# Patient Record
Sex: Male | Born: 1990
Health system: Southern US, Community
[De-identification: ages and names within clinical notes are randomized; demographics above are authoritative.]

---

## 2015-10-31 DIAGNOSIS — I10 Essential (primary) hypertension: Secondary | ICD-10-CM

## 2015-10-31 HISTORY — DX: Essential (primary) hypertension: I10

## 2016-04-01 HISTORY — PX: MULTIPLE TOOTH EXTRACTIONS: SHX2053

## 2016-05-27 DIAGNOSIS — E782 Mixed hyperlipidemia: Secondary | ICD-10-CM | POA: Insufficient documentation

## 2016-05-27 HISTORY — DX: Mixed hyperlipidemia: E78.2

## 2017-02-05 DIAGNOSIS — F4310 Post-traumatic stress disorder, unspecified: Secondary | ICD-10-CM | POA: Insufficient documentation

## 2017-02-05 HISTORY — DX: Post-traumatic stress disorder, unspecified: F43.10

## 2020-09-25 ENCOUNTER — Emergency Department (HOSPITAL_COMMUNITY)
Admission: EM | Admit: 2020-09-25 | Discharge: 2020-09-25 | Disposition: A | Discharge status: Home / Self Care | Payer: BC Managed Care – PPO | Attending: Emergency Medicine | Admitting: Emergency Medicine

## 2020-09-25 ENCOUNTER — Encounter (HOSPITAL_COMMUNITY): Payer: Self-pay | Admitting: Emergency Medicine

## 2020-09-25 ENCOUNTER — Emergency Department (HOSPITAL_COMMUNITY): Payer: BC Managed Care – PPO

## 2020-09-25 ENCOUNTER — Other Ambulatory Visit: Payer: Self-pay

## 2020-09-25 DIAGNOSIS — I10 Essential (primary) hypertension: Secondary | ICD-10-CM

## 2020-09-25 DIAGNOSIS — R519 Headache, unspecified: Secondary | ICD-10-CM | POA: Diagnosis not present

## 2020-09-25 LAB — TROPONIN I (HIGH SENSITIVITY)
Troponin I (High Sensitivity): 3 ng/L (ref <18)
Troponin I (High Sensitivity): 3 ng/L (ref <18)

## 2020-09-25 LAB — CBC WITH DIFFERENTIAL/PLATELET
Abs Immature Granulocytes: 0.02 10*3/uL (ref 0.00–0.07)
Basophils Absolute: 0 10*3/uL (ref 0.0–0.1)
Basophils Relative: 0 %
Eosinophils Absolute: 0.2 10*3/uL (ref 0.0–0.5)
Eosinophils Relative: 4 %
HCT: 47.1 % (ref 39.0–52.0)
Hemoglobin: 16.4 g/dL (ref 13.0–17.0)
Immature Granulocytes: 0 %
Lymphocytes Relative: 31 %
Lymphs Abs: 1.9 10*3/uL (ref 0.7–4.0)
MCH: 31.3 pg (ref 26.0–34.0)
MCHC: 34.8 g/dL (ref 30.0–36.0)
MCV: 89.9 fL (ref 80.0–100.0)
Monocytes Absolute: 0.5 10*3/uL (ref 0.1–1.0)
Monocytes Relative: 8 %
Neutro Abs: 3.5 10*3/uL (ref 1.7–7.7)
Neutrophils Relative %: 57 %
Platelets: 266 10*3/uL (ref 150–400)
RBC: 5.24 MIL/uL (ref 4.22–5.81)
RDW: 11.4 % — ABNORMAL LOW (ref 11.5–15.5)
WBC: 6.2 10*3/uL (ref 4.0–10.5)
nRBC: 0 % (ref 0.0–0.2)

## 2020-09-25 LAB — BASIC METABOLIC PANEL
Anion gap: 9 (ref 5–15)
BUN: 10 mg/dL (ref 6–20)
CO2: 25 mmol/L (ref 22–32)
Calcium: 9.1 mg/dL (ref 8.9–10.3)
Chloride: 104 mmol/L (ref 98–111)
Creatinine, Ser: 0.9 mg/dL (ref 0.61–1.24)
GFR, Estimated: >60 mL/min (ref >60)
Glucose, Bld: 125 mg/dL — ABNORMAL HIGH (ref 70–99)
Potassium: 4 mmol/L (ref 3.5–5.1)
Sodium: 138 mmol/L (ref 135–145)

## 2020-09-25 MED ORDER — HYDROCHLOROTHIAZIDE 12.5 MG PO TABS: 12.5000 mg | ORAL_TABLET | Freq: Every day | ORAL | 0 refills | Status: DC

## 2020-09-25 MED ORDER — CLONIDINE HCL 0.1 MG PO TABS
0.1000 mg | ORAL_TABLET | Freq: Once | ORAL | Status: AC
  Administered 2020-09-25: 0.1 mg via ORAL
  Filled 2020-09-25: qty 1

## 2020-09-25 NOTE — ED Provider Notes (Signed)
[facility] EMERGENCY DEPARTMENT Provider Note   CSN: [id number] Arrival date & time: 09/25/20  0831    History Hypertension  Reginald Kaiser is a 30 y.o. male past medical history significant for hypertension who presents for evaluation of elevated blood pressure.  Was previously on 4 blood pressure medications up until 2 years ago.  Patient states he had a work-up and they could not find a cause for this.  His blood pressures ended up improving and he was subsequently stopped.  Patient had a headache over the weekend, felt lightheaded and dizzy.  States he took his blood pressure which was elevated.  He took a few of his daughters clonidine tablets which resolved his headache.  Has had persistently elevated blood pressures.  Came here for evaluation.  Stated earlier today he felt "tingly" all over.  He had no associated weakness, facial droop, difficulty with word finding, headache.  No neck pain or neck stiffness.  Took blood pressure at work sent here for evaluation.  He currently has no symptoms.  Denies additional rating or alleviating factors.  No emesis, chest pain, shortness breath, abdominal pain, weakness, vision changes  History obtained from patient and past medical records.  No interpreter used    HPI     Past Medical History:  Diagnosis Date   Hypertension     There are no problems to display for this patient.   History reviewed. No pertinent surgical history.     No family history on file.     Home Medications Prior to Admission medications   Medication Sig Start Date End Date Taking? Authorizing Provider  hydrochlorothiazide (HYDRODIURIL) 12.5 MG tablet Take 1 tablet (12.5 mg total) by mouth daily. 09/25/20 10/25/20 Yes Cephus Tupy A, PA-C    Allergies    Patient has no allergy information on record.  Review of Systems   Review of Systems  Constitutional: Negative.   HENT: Negative.    Respiratory: Negative.     Cardiovascular: Negative.   Genitourinary: Negative.   Musculoskeletal: Negative.   Neurological:  Positive for dizziness, light-headedness and headaches. Negative for tremors, seizures, syncope, facial asymmetry, speech difficulty and weakness. Numbness: Tingling. All other systems reviewed and are negative.  Physical Exam Updated Vital Signs BP (!) 140/99   Pulse 66   Temp 98.3 F (36.8 C) (Oral)   Resp 16   Ht 5\' 8"  (1.727 m)   Wt 81.6 kg   SpO2 98%   BMI 27.37 kg/m   Physical Exam Physical Exam  Constitutional: Pt is oriented to person, place, and time. Pt appears well-developed and well-nourished. No distress.  HENT:  Head: Normocephalic and atraumatic.  Mouth/Throat: Oropharynx is clear and moist.  Eyes: Conjunctivae and EOM are normal. Pupils are equal, round, and reactive to light. No scleral icterus.  No horizontal, vertical or rotational nystagmus  Neck: Normal range of motion. Neck supple.  Full active and passive ROM without pain No midline or paraspinal tenderness No nuchal rigidity or meningeal signs  Cardiovascular: Normal rate, regular rhythm and intact distal pulses.   Pulmonary/Chest: Effort normal and breath sounds normal. No respiratory distress. Pt has no wheezes. No rales.  Abdominal: Soft. Bowel sounds are normal. There is no tenderness. There is no rebound and no guarding.  Musculoskeletal: Normal range of motion.  Lymphadenopathy:    No cervical adenopathy.  Neurological: Pt. is alert and oriented to person, place, and time. He has normal reflexes. No cranial nerve deficit.  Exhibits normal muscle  tone. Coordination normal.  Mental Status:  Alert, oriented, thought content appropriate. Speech fluent without evidence of aphasia. Able to follow 2 step commands without difficulty.  Cranial Nerves:  II:  Peripheral visual fields grossly normal, pupils equal, round, reactive to light III,IV, VI: ptosis not present, extra-ocular motions intact  bilaterally  V,VII: smile symmetric, facial light touch sensation equal VIII: hearing grossly normal bilaterally  IX,X: midline uvula rise  XI: bilateral shoulder shrug equal and strong XII: midline tongue extension  Motor:  5/5 in upper and lower extremities bilaterally including strong and equal grip strength and dorsiflexion/plantar flexion Sensory: Pinprick and light touch normal in all extremities.  Deep Tendon Reflexes: 2+ and symmetric  Cerebellar: normal finger-to-nose with bilateral upper extremities Gait: normal gait and balance CV: distal pulses palpable throughout   Skin: Skin is warm and dry. No rash noted. Pt is not diaphoretic.  Psychiatric: Pt has a normal mood and affect. Behavior is normal. Judgment and thought content normal.  Nursing note and vitals reviewed.  ED Results / Procedures / Treatments   Labs (all labs ordered are listed, but only abnormal results are displayed) Labs Reviewed  CBC WITH DIFFERENTIAL/PLATELET - Abnormal; Notable for the following components:      Result Value   RDW 11.4 (*)    All other components within normal limits  BASIC METABOLIC PANEL - Abnormal; Notable for the following components:   Glucose, Bld 125 (*)    All other components within normal limits  TROPONIN I (HIGH SENSITIVITY)  TROPONIN I (HIGH SENSITIVITY)    EKG EKG Interpretation  Date/Time:  Monday September 25 2020 09:33:20 EDT Ventricular Rate:  76 PR Interval:  143 QRS Duration: 84 QT Interval:  368 QTC Calculation: 414 R Axis:   44 Text Interpretation: Sinus arrhythmia RSR' in V1 or V2, probably normal variant No previous tracing Confirmed by Gwyneth Sprout ([id number]) on 09/25/2020 10:42:24 AM  Radiology CT Head Wo Contrast  Result Date: 09/25/2020 CLINICAL DATA:  Chronic headaches EXAM: CT HEAD WITHOUT CONTRAST TECHNIQUE: Contiguous axial images were obtained from the base of the skull through the vertex without intravenous contrast. COMPARISON:  None. FINDINGS:  Brain: No evidence of acute infarction, hemorrhage, hydrocephalus, extra-axial collection or mass lesion/mass effect. Vascular: No hyperdense vessel or unexpected calcification. Skull: Normal. Negative for fracture or focal lesion. Sinuses/Orbits: No acute finding. Other: None. IMPRESSION: No acute intracranial abnormality noted. Electronically Signed   By: Alcide Clever M.D.   On: 09/25/2020 10:13    Procedures Procedures   Medications Ordered in ED Medications  cloNIDine (CATAPRES) tablet 0.1 mg (0.1 mg Oral Given 09/25/20 1052)  cloNIDine (CATAPRES) tablet 0.1 mg (0.1 mg Oral Given 09/25/20 1156)    ED Course  I have reviewed the triage vital signs and the nursing notes.  Pertinent labs & imaging results that were available during my care of the patient were reviewed by me and considered in my medical decision making (see chart for details).  Here for evaluation of HTN.  Afebrile, nonseptic, non-ill-appearing.  He has a nonfocal neuro exam without deficits.  Did have a headache, lightheadedness, dizziness and "tingling all over" earlier this weekend.  He has no current complaints.  Heart and lungs clear.  Abdomen soft, nontender. NV intact.  Labs and imaging personally reviewed and interpreted:  CBC without leukocytosis BMP with glucose at 125 no additional electrolyte, renal abnormality Troponin 3, 3 CT head without acute changes EKG without ischemic changes  Patient reassessed. Continues to be wo  complaints. Discussed labs and imaging. Low suspicion for hypertensive urgency or emergency.  DC home with return precautions to follow-up with PCP for blood pressure management.  Given resources for outpatient follow-up.  Significant provement in blood pressure here in the ED.   The patient has been appropriately medically screened and/or stabilized in the ED. I have low suspicion for any other emergent medical condition which would require further screening, evaluation or treatment in the  ED or require inpatient management.  Patient is hemodynamically stable and in no acute distress.  Patient able to ambulate in department prior to ED.  Evaluation does not show acute pathology that would require ongoing or additional emergent interventions while in the emergency department or further inpatient treatment.  I have discussed the diagnosis with the patient and answered all questions.  Pain is been managed while in the emergency department and patient has no further complaints prior to discharge.  Patient is comfortable with plan discussed in room and is stable for discharge at this time.  I have discussed strict return precautions for returning to the emergency department.  Patient was encouraged to follow-up with PCP/specialist refer to at discharge.      MDM Rules/Calculators/A&P                          Final Clinical Impression(s) / ED Diagnoses Final diagnoses:  Hypertension, unspecified type    Rx / DC Orders ED Discharge Orders          Ordered    hydrochlorothiazide (HYDRODIURIL) 12.5 MG tablet  Daily        09/25/20 1143             Tanikka Bresnan A, PA-C 09/25/20 1355    Gwyneth Sprout, MD 09/25/20 1537

## 2020-09-25 NOTE — ED Triage Notes (Signed)
Pt here from work with c/o HTN , was on b/p from time he was 16 until 2 years ago , started back having problem with his B/p this week

## 2020-09-25 NOTE — Discharge Instructions (Addendum)
Take the blood pressure medication that I have prescribed.  Follow-up with a primary care provider.  If you do not have one I have listed one in your discharge paperwork.  You may call them to schedule appointment.  I have written you a work note for your visit here in the emergency department.  Return for new or worsening symptoms

## 2020-09-27 ENCOUNTER — Ambulatory Visit (INDEPENDENT_AMBULATORY_CARE_PROVIDER_SITE_OTHER): Payer: BC Managed Care – PPO | Admitting: Legal Medicine

## 2020-09-27 ENCOUNTER — Other Ambulatory Visit: Payer: Self-pay

## 2020-09-27 ENCOUNTER — Encounter: Payer: Self-pay | Admitting: Legal Medicine

## 2020-09-27 VITALS — BP 140/88 | HR 60 | Temp 98.4°F | Resp 16 | Ht 68.0 in | Wt 176.0 lb

## 2020-09-27 DIAGNOSIS — I10 Essential (primary) hypertension: Secondary | ICD-10-CM | POA: Diagnosis not present

## 2020-09-27 DIAGNOSIS — E782 Mixed hyperlipidemia: Secondary | ICD-10-CM

## 2020-09-27 MED ORDER — DILTIAZEM HCL 60 MG PO TABS: 60.0000 mg | ORAL_TABLET | Freq: Two times a day (BID) | ORAL | 3 refills | Status: DC

## 2020-09-27 NOTE — Progress Notes (Signed)
New Patient Office Visit  Subjective:  Patient ID: Reginald Kaiser, male    DOB: 29-Sep-1990  Age: 30 y.o. MRN: [id number]  CC:  Chief Complaint  Patient presents with   New Patient (Initial Visit)   Hypertension    Patient went to [facility] ED on 09/25/2020 because his blood pressure was 210/120.    HPI Reginald Kaiser presents for hypertension.  He was sen in ER with BP 210 and dizziness.  He was put on HCTZ 12.5mg , He formerly was on 4 medicines and all stopped by former doctor with full renal and pheo workup that was negative.  The CT and ekg were normal in ER. ACE medicines no help.  Past Medical History:  Diagnosis Date   Essential hypertension 10/31/2015   Mixed hyperlipidemia 05/27/2016   PTSD (post-traumatic stress disorder) 02/05/2017    Past Surgical History:  Procedure Laterality Date   MULTIPLE TOOTH EXTRACTIONS  2018    Family History  Problem Relation Age of Onset   Diabetes Mother    Hypertension Mother    Stroke Mother    Hyperlipidemia Father     Social History   Socioeconomic History   Marital status: Single    Spouse name: Not on file   Number of children: 1   Years of education: Not on file   Highest education level: Not on file  Occupational History   Occupation: medical assistant  Tobacco Use   Smoking status: Former    Pack years: 0.00    Types: Cigarettes    Quit date: 2015    Years since quitting: 7.4   Smokeless tobacco: Never  Vaping Use   Vaping Use: Never used  Substance and Sexual Activity   Alcohol use: Yes    Alcohol/week: 2.0 standard drinks    Types: 2 Standard drinks or equivalent per week    Comment: every night   Drug use: Never   Sexual activity: Yes    Partners: Female  Other Topics Concern   Not on file  Social History Narrative   Not on file   Social Determinants of Health   Financial Resource Strain: Not on file  Food Insecurity: Not on file  Transportation Needs: Not on file  Physical  Activity: Not on file  Stress: Not on file  Social Connections: Not on file  Intimate Partner Violence: Not on file    ROS Review of Systems  Constitutional:  Negative for chills, fatigue and fever.  HENT:  Negative for congestion, ear pain and sore throat.   Respiratory:  Negative for cough and shortness of breath.   Cardiovascular:  Negative for chest pain.  Gastrointestinal:  Negative for abdominal pain, constipation, diarrhea, nausea and vomiting.  Endocrine: Negative for polydipsia, polyphagia and polyuria.  Genitourinary:  Negative for dysuria and frequency.  Musculoskeletal:  Negative for arthralgias and myalgias.  Neurological:  Negative for dizziness and headaches.  Psychiatric/Behavioral:  Negative for dysphoric mood.        No dysphoria   Objective:   Today's Vitals: BP (!) 160/100   Pulse 60   Temp 98.4 F (36.9 C)   Resp 16   Ht 5\' 8"  (1.727 m)   Wt 176 lb (79.8 kg)   SpO2 98%   BMI 26.76 kg/m   Physical Exam Vitals reviewed.  Constitutional:      Appearance: Normal appearance.  HENT:     Head: Normocephalic.     Right Ear: Tympanic membrane, ear canal and external ear normal.  Left Ear: Tympanic membrane, ear canal and external ear normal.     Nose: Nose normal.     Mouth/Throat:     Mouth: Mucous membranes are moist.     Pharynx: Oropharynx is clear.  Eyes:     Extraocular Movements: Extraocular movements intact.     Conjunctiva/sclera: Conjunctivae normal.     Pupils: Pupils are equal, round, and reactive to light.  Cardiovascular:     Rate and Rhythm: Normal rate and regular rhythm.     Pulses: Normal pulses.     Heart sounds: Normal heart sounds. No murmur heard.   No gallop.  Pulmonary:     Effort: Pulmonary effort is normal. No respiratory distress.     Breath sounds: Normal breath sounds. No wheezing.  Abdominal:     General: Abdomen is flat. Bowel sounds are normal. There is no distension.     Palpations: Abdomen is soft.      Tenderness: There is no abdominal tenderness.  Musculoskeletal:        General: Normal range of motion.     Cervical back: Normal range of motion and neck supple.  Skin:    General: Skin is warm and dry.     Capillary Refill: Capillary refill takes less than 2 seconds.  Neurological:     General: No focal deficit present.     Mental Status: He is alert and oriented to person, place, and time. Mental status is at baseline.  Psychiatric:        Mood and Affect: Mood normal.        Behavior: Behavior normal.        Thought Content: Thought content normal.        Judgment: Judgment normal.    Assessment & Plan:   Problem List Items Addressed This Visit       Cardiovascular and Mediastinum   Essential hypertension - Primary   Relevant Medications   diltiazem (CARDIZEM) 60 MG tablet   Other Relevant Orders   Comprehensive metabolic panel An individual hypertension care plan was established and reinforced today.  The patient's status was assessed using clinical findings on exam and labs or diagnostic tests. The patient's success at meeting treatment goals on disease specific evidence-based guidelines and found to be fair controlled. SELF MANAGEMENT: The patient and I together assessed ways to personally work towards obtaining the recommended goals. He needs further control, start diltiazem. RECOMMENDATIONS: avoid decongestants found in common cold remedies, decrease consumption of alcohol, perform routine monitoring of BP with home BP cuff, exercise, reduction of dietary salt, take medicines as prescribed, try not to miss doses and quit smoking.  Regular exercise and maintaining a healthy weight is needed.  Stress reduction may help. A CLINICAL SUMMARY including written plan identify barriers to care unique to individual due to social or financial issues.  We attempt to mutually creat solutions for individual and family understanding.      Other   Mixed hyperlipidemia   Relevant  Medications   diltiazem (CARDIZEM) 60 MG tablet   Other Relevant Orders   Lipid panel AN INDIVIDUAL CARE PLAN for hyperlipidemia/ cholesterol was established and reinforced today.  The patient's status was assessed using clinical findings on exam, lab and other diagnostic tests. The patient's disease status was assessed based on evidence-based guidelines and found to be well controlled. MEDICATIONS were reviewed. SELF MANAGEMENT GOALS have been discussed and patient's success at attaining the goal of low cholesterol was assessed. RECOMMENDATION given include regular exercise  3 days a week and low cholesterol/low fat diet. CLINICAL SUMMARY including written plan to identify barriers unique to the patient due to social or economic  reasons was discussed.     Outpatient Encounter Medications as of 09/27/2020  Medication Sig   diltiazem (CARDIZEM) 60 MG tablet Take 1 tablet (60 mg total) by mouth 2 (two) times daily.   hydrochlorothiazide (HYDRODIURIL) 12.5 MG tablet Take 1 tablet (12.5 mg total) by mouth daily.   No facility-administered encounter medications on file as of 09/27/2020.    Follow-up: Return in about 1 month (around 10/27/2020) for for bp.   Brent Bulla, MD

## 2020-09-28 LAB — LIPID PANEL
Chol/HDL Ratio: 4.5 ratio (ref 0.0–5.0)
Cholesterol, Total: 229 mg/dL — ABNORMAL HIGH (ref 100–199)
HDL: 51 mg/dL (ref >39)
LDL Chol Calc (NIH): 138 mg/dL — ABNORMAL HIGH (ref 0–99)
Triglycerides: 224 mg/dL — ABNORMAL HIGH (ref 0–149)
VLDL Cholesterol Cal: 40 mg/dL (ref 5–40)

## 2020-09-28 LAB — COMPREHENSIVE METABOLIC PANEL
ALT: 39 IU/L (ref 0–44)
AST: 26 IU/L (ref 0–40)
Albumin/Globulin Ratio: 1.9 (ref 1.2–2.2)
Albumin: 5 g/dL (ref 4.0–5.0)
Alkaline Phosphatase: 106 IU/L (ref 44–121)
BUN/Creatinine Ratio: 9 (ref 9–20)
BUN: 9 mg/dL (ref 6–20)
Bilirubin Total: 0.9 mg/dL (ref 0.0–1.2)
CO2: 26 mmol/L (ref 20–29)
Calcium: 9.7 mg/dL (ref 8.7–10.2)
Chloride: 101 mmol/L (ref 96–106)
Creatinine, Ser: 0.95 mg/dL (ref 0.76–1.27)
Globulin, Total: 2.6 g/dL (ref 1.5–4.5)
Glucose: 100 mg/dL — ABNORMAL HIGH (ref 65–99)
Potassium: 4.3 mmol/L (ref 3.5–5.2)
Sodium: 142 mmol/L (ref 134–144)
Total Protein: 7.6 g/dL (ref 6.0–8.5)
eGFR: 110 mL/min/{1.73_m2} (ref >59)

## 2020-09-28 LAB — CARDIOVASCULAR RISK ASSESSMENT

## 2020-09-28 NOTE — Progress Notes (Signed)
Glucose 100,  kidney and liver tests ok, triglycerides high 224 and LDLc 138 both high- need to start DASH diet,  lp

## 2020-10-11 ENCOUNTER — Encounter: Payer: Self-pay | Admitting: Legal Medicine

## 2020-10-11 ENCOUNTER — Other Ambulatory Visit: Payer: Self-pay | Admitting: Legal Medicine

## 2020-10-11 ENCOUNTER — Other Ambulatory Visit: Payer: Self-pay

## 2020-10-11 DIAGNOSIS — I10 Essential (primary) hypertension: Secondary | ICD-10-CM

## 2020-10-11 MED ORDER — HYDROCHLOROTHIAZIDE 12.5 MG PO TABS: 12.5000 mg | ORAL_TABLET | Freq: Every day | ORAL | 2 refills | Status: DC

## 2020-10-12 ENCOUNTER — Encounter: Payer: Self-pay | Admitting: Legal Medicine

## 2020-10-30 ENCOUNTER — Ambulatory Visit: Payer: BC Managed Care – PPO | Admitting: Legal Medicine

## 2020-11-06 ENCOUNTER — Ambulatory Visit (INDEPENDENT_AMBULATORY_CARE_PROVIDER_SITE_OTHER): Payer: BC Managed Care – PPO | Admitting: Legal Medicine

## 2020-11-06 ENCOUNTER — Other Ambulatory Visit: Payer: Self-pay

## 2020-11-06 ENCOUNTER — Encounter: Payer: Self-pay | Admitting: Legal Medicine

## 2020-11-06 VITALS — BP 125/70 | HR 101 | Temp 97.5°F | Ht 68.0 in | Wt 171.0 lb

## 2020-11-06 DIAGNOSIS — E782 Mixed hyperlipidemia: Secondary | ICD-10-CM | POA: Diagnosis not present

## 2020-11-06 DIAGNOSIS — I10 Essential (primary) hypertension: Secondary | ICD-10-CM | POA: Diagnosis not present

## 2020-11-06 MED ORDER — ROSUVASTATIN CALCIUM 20 MG PO TABS: 20.0000 mg | ORAL_TABLET | Freq: Every day | ORAL | 3 refills | Status: DC

## 2020-11-06 NOTE — Progress Notes (Signed)
Established Patient Office Visit  Subjective:  Patient ID: Reginald Kaiser, male    DOB: 04/25/1990  Age: 30 y.o. MRN: [id number]  CC:  Chief Complaint  Patient presents with   Hypertension    HPI Even [name] presents for hypertension.  He is on diltiazem and HCTZ, he feels good.  No problems with urine.  Past Medical History:  Diagnosis Date   Essential hypertension 10/31/2015   Mixed hyperlipidemia 05/27/2016   PTSD (post-traumatic stress disorder) 02/05/2017    Past Surgical History:  Procedure Laterality Date   MULTIPLE TOOTH EXTRACTIONS  2018    Family History  Problem Relation Age of Onset   Diabetes Mother    Hypertension Mother    Stroke Mother    Hyperlipidemia Father     Social History   Socioeconomic History   Marital status: Single    Spouse name: Not on file   Number of children: 1   Years of education: Not on file   Highest education level: Not on file  Occupational History   Occupation: medical assistant  Tobacco Use   Smoking status: Former    Types: Cigarettes    Quit date: 2015    Years since quitting: 7.6   Smokeless tobacco: Never  Vaping Use   Vaping Use: Never used  Substance and Sexual Activity   Alcohol use: Yes    Alcohol/week: 2.0 standard drinks    Types: 2 Standard drinks or equivalent per week    Comment: every night   Drug use: Never   Sexual activity: Yes    Partners: Female  Other Topics Concern   Not on file  Social History Narrative   Not on file   Social Determinants of Health   Financial Resource Strain: Not on file  Food Insecurity: Not on file  Transportation Needs: Not on file  Physical Activity: Not on file  Stress: Not on file  Social Connections: Not on file  Intimate Partner Violence: Not on file    Outpatient Medications Prior to Visit  Medication Sig Dispense Refill   diltiazem (CARDIZEM) 60 MG tablet Take 1 tablet (60 mg total) by mouth 2 (two) times daily. 60 tablet 3    hydrochlorothiazide (HYDRODIURIL) 12.5 MG tablet Take 1 tablet (12.5 mg total) by mouth daily. 90 tablet 2   No facility-administered medications prior to visit.    Not on File  ROS Review of Systems  Constitutional:  Negative for activity change and appetite change.  HENT:  Negative for congestion.   Eyes:  Negative for visual disturbance.  Respiratory:  Negative for chest tightness and shortness of breath.   Cardiovascular:  Negative for chest pain and palpitations.  Gastrointestinal:  Negative for abdominal distention and abdominal pain.  Genitourinary: Negative.   Musculoskeletal:  Negative for arthralgias and back pain.  Neurological: Negative.   Psychiatric/Behavioral: Negative.       Objective:    Physical Exam  BP 125/70   Pulse (!) 101   Temp (!) 97.5 F (36.4 C)   Ht _0  (1.727 m)   Wt 171 lb (77.6 kg)   SpO2 98%   BMI 26.00 kg/m  Wt Readings from Last 3 Encounters:  11/06/20 171 lb (77.6 kg)  09/27/20 176 lb (79.8 kg)  09/25/20 180 lb (81.6 kg)     Health Maintenance Due  Topic Date Due   HIV Screening  Never done   Hepatitis C Screening  Never done   INFLUENZA VACCINE  10/30/2020  There are no preventive care reminders to display for this patient.  No results found for: TSH Lab Results  Component Value Date   WBC 6.2 09/25/2020   HGB 16.4 09/25/2020   HCT 47.1 09/25/2020   MCV 89.9 09/25/2020   PLT 266 09/25/2020   Lab Results  Component Value Date   NA 142 09/27/2020   K 4.3 09/27/2020   CO2 26 09/27/2020   GLUCOSE 100 (H) 09/27/2020   BUN 9 09/27/2020   CREATININE 0.95 09/27/2020   BILITOT 0.9 09/27/2020   ALKPHOS 106 09/27/2020   AST 26 09/27/2020   ALT 39 09/27/2020   PROT 7.6 09/27/2020   ALBUMIN 5.0 09/27/2020   CALCIUM 9.7 09/27/2020   ANIONGAP 9 09/25/2020   EGFR 110 09/27/2020   Lab Results  Component Value Date   CHOL 229 (H) 09/27/2020   Lab Results  Component Value Date   HDL 51 09/27/2020   Lab Results   Component Value Date   LDLCALC 138 (H) 09/27/2020   Lab Results  Component Value Date   TRIG 224 (H) 09/27/2020   Lab Results  Component Value Date   CHOLHDL 4.5 09/27/2020   No results found for: HGBA1C    Assessment & Plan:   Diagnoses and all orders for this visit: Essential hypertension An individual hypertension care plan was established and reinforced today.  The patient's status was assessed using clinical findings on exam and labs or diagnostic tests. The patient's success at meeting treatment goals on disease specific evidence-based guidelines and found to be well controlled. SELF MANAGEMENT: The patient and I together assessed ways to personally work towards obtaining the recommended goals. RECOMMENDATIONS: avoid decongestants found in common cold remedies, decrease consumption of alcohol, perform routine monitoring of BP with home BP cuff, exercise, reduction of dietary salt, take medicines as prescribed, try not to miss doses and quit smoking.  Regular exercise and maintaining a healthy weight is needed.  Stress reduction may help. A CLINICAL SUMMARY including written plan identify barriers to care unique to individual due to social or financial issues.  We attempt to mutually creat solutions for individual and family understanding.   Mixed hyperlipidemia -     rosuvastatin (CRESTOR) 20 MG tablet; Take 1 tablet (20 mg total) by mouth daily.  Cholesterol remains elevated despite diet.  Start statin, recheck cholesterol at work     Follow-up: Return in about 6 months (around 05/09/2021).    Reginald Meeker, MD

## 2021-01-10 ENCOUNTER — Other Ambulatory Visit: Payer: Self-pay | Admitting: Legal Medicine

## 2021-01-10 DIAGNOSIS — I10 Essential (primary) hypertension: Secondary | ICD-10-CM

## 2021-01-18 ENCOUNTER — Other Ambulatory Visit: Payer: Self-pay

## 2021-01-18 DIAGNOSIS — I10 Essential (primary) hypertension: Secondary | ICD-10-CM

## 2021-01-18 MED ORDER — HYDROCHLOROTHIAZIDE 12.5 MG PO TABS: 12.5000 mg | ORAL_TABLET | Freq: Every day | ORAL | 0 refills | Status: DC

## 2021-01-18 NOTE — Telephone Encounter (Signed)
Pharmacy called for request. Pt and family going out of town. Sent 90/2 in July to [facility] but requests this go to [facility].   Lorita Officer, [place] 01/18/21 3:13 PM

## 2021-03-17 ENCOUNTER — Other Ambulatory Visit: Payer: Self-pay | Admitting: Family Medicine

## 2021-03-17 DIAGNOSIS — I10 Essential (primary) hypertension: Secondary | ICD-10-CM

## 2021-03-17 NOTE — Telephone Encounter (Signed)
Your patient. KC °

## 2021-03-19 ENCOUNTER — Other Ambulatory Visit: Payer: Self-pay | Admitting: Legal Medicine

## 2021-03-19 DIAGNOSIS — I10 Essential (primary) hypertension: Secondary | ICD-10-CM

## 2021-03-19 MED ORDER — DILTIAZEM HCL 60 MG PO TABS: ORAL_TABLET | ORAL | 3 refills | Status: DC

## 2021-04-02 ENCOUNTER — Other Ambulatory Visit: Payer: Self-pay | Admitting: Family Medicine

## 2021-04-02 DIAGNOSIS — I10 Essential (primary) hypertension: Secondary | ICD-10-CM

## 2021-04-02 NOTE — Telephone Encounter (Signed)
Your patient. KC °

## 2021-04-10 ENCOUNTER — Other Ambulatory Visit: Payer: Self-pay | Admitting: Physician Assistant

## 2021-04-10 DIAGNOSIS — I10 Essential (primary) hypertension: Secondary | ICD-10-CM

## 2021-05-13 NOTE — Progress Notes (Unsigned)
Subjective:  Patient ID: Reginald Kaiser, male    DOB: [birth year]  Age: 31 y.o. MRN: [id number]  Chief Complaint  Patient presents with   Hypertension   Hyperlipidemia    HPI   Hypertension: Patient is taking Diltiazem 60 mg twice daily, hydrochlorothiazide 12.5 mg daily.  Hyperlipidemia: He takes Rosuvastatin 20 mg daily. Current Outpatient Medications on File Prior to Visit  Medication Sig Dispense Refill   diltiazem (CARDIZEM) 60 MG tablet TAKE ONE TABLET (60omg) BY MOUTH TWICE DAILY 60 tablet 6   hydrochlorothiazide (HYDRODIURIL) 12.5 MG tablet Take 1 tablet (12.5 mg total) by mouth daily. 90 tablet 0   rosuvastatin (CRESTOR) 20 MG tablet Take 1 tablet (20 mg total) by mouth daily. 90 tablet 3   No current facility-administered medications on file prior to visit.   Past Medical History:  Diagnosis Date   Essential hypertension 10/31/2015   Mixed hyperlipidemia 05/27/2016   PTSD (post-traumatic stress disorder) 02/05/2017   Past Surgical History:  Procedure Laterality Date   MULTIPLE TOOTH EXTRACTIONS  2018    Family History  Problem Relation Age of Onset   Diabetes Mother    Hypertension Mother    Stroke Mother    Hyperlipidemia Father    Social History   Socioeconomic History   Marital status: Single    Spouse name: Not on file   Number of children: 1   Years of education: Not on file   Highest education level: Not on file  Occupational History   Occupation: medical assistant  Tobacco Use   Smoking status: Former    Types: Cigarettes    Quit date: 2015    Years since quitting: 8.1   Smokeless tobacco: Never  Vaping Use   Vaping Use: Never used  Substance and Sexual Activity   Alcohol use: Yes    Alcohol/week: 2.0 standard drinks    Types: 2 Standard drinks or equivalent per week    Comment: every night   Drug use: Never   Sexual activity: Yes    Partners: Female  Other Topics Concern   Not on file  Social History Narrative   Not on file    Social Determinants of Health   Financial Resource Strain: Not on file  Food Insecurity: Not on file  Transportation Needs: Not on file  Physical Activity: Not on file  Stress: Not on file  Social Connections: Not on file    Review of Systems   Objective:  There were no vitals taken for this visit.  BP/Weight 11/06/2020 09/27/2020 [id number]  Systolic BP 0000000 [id number] 0000000  Diastolic BP 70 88 [id number]  Wt. (Lbs) 171 176 180  BMI 26 26.76 27.37    Physical Exam  Diabetic Foot Exam - Simple   No data filed      Lab Results  Component Value Date   WBC 6.2 09/25/2020   HGB 16.4 09/25/2020   HCT 47.1 09/25/2020   PLT 266 09/25/2020   GLUCOSE 100 (H) 09/27/2020   CHOL 229 (H) 09/27/2020   TRIG 224 (H) 09/27/2020   HDL 51 09/27/2020   LDLCALC 138 (H) 09/27/2020   ALT 39 09/27/2020   AST 26 09/27/2020   NA 142 09/27/2020   K 4.3 09/27/2020   CL 101 09/27/2020   CREATININE 0.95 09/27/2020   BUN 9 09/27/2020   CO2 26 09/27/2020      Assessment & Plan:   Problem List Items Addressed This Visit       Cardiovascular and Mediastinum  Essential hypertension - Primary     Other   Mixed hyperlipidemia  .  No orders of the defined types were placed in this encounter.   No orders of the defined types were placed in this encounter.    Follow-up: No follow-ups on file.  An After Visit Summary was printed and given to the patient.  [name], MD [facility] [phone]

## 2021-05-14 ENCOUNTER — Ambulatory Visit: Payer: BC Managed Care – PPO | Admitting: Legal Medicine

## 2021-05-15 ENCOUNTER — Other Ambulatory Visit: Payer: Self-pay

## 2021-05-15 ENCOUNTER — Ambulatory Visit (INDEPENDENT_AMBULATORY_CARE_PROVIDER_SITE_OTHER): Payer: Self-pay | Admitting: Legal Medicine

## 2021-05-15 DIAGNOSIS — E782 Mixed hyperlipidemia: Secondary | ICD-10-CM

## 2021-05-15 DIAGNOSIS — I10 Essential (primary) hypertension: Secondary | ICD-10-CM

## 2021-11-04 ENCOUNTER — Other Ambulatory Visit: Payer: Self-pay | Admitting: Legal Medicine

## 2021-11-04 DIAGNOSIS — I10 Essential (primary) hypertension: Secondary | ICD-10-CM

## 2022-07-17 ENCOUNTER — Other Ambulatory Visit: Payer: Self-pay

## 2022-07-17 DIAGNOSIS — I10 Essential (primary) hypertension: Secondary | ICD-10-CM

## 2022-07-17 MED ORDER — DILTIAZEM HCL 60 MG PO TABS: ORAL_TABLET | ORAL | 0 refills | Status: DC

## 2022-07-17 NOTE — Telephone Encounter (Signed)
No further prescriptions.  Patient way overdue for appt.  Please call patient and set up a fasting appointment with Reginald Kaiser, PAC.

## 2022-07-22 NOTE — Telephone Encounter (Signed)
I have the patient scheduled for May 16 with Huston Foley.

## 2022-08-15 ENCOUNTER — Ambulatory Visit (INDEPENDENT_AMBULATORY_CARE_PROVIDER_SITE_OTHER): Payer: Medicaid Other | Admitting: Physician Assistant

## 2022-08-15 ENCOUNTER — Encounter: Payer: Self-pay | Admitting: Physician Assistant

## 2022-08-15 ENCOUNTER — Other Ambulatory Visit: Payer: Self-pay | Admitting: Family Medicine

## 2022-08-15 VITALS — BP 152/102 | HR 87 | Temp 97.3°F | Ht 68.0 in | Wt 170.0 lb

## 2022-08-15 DIAGNOSIS — I10 Essential (primary) hypertension: Secondary | ICD-10-CM

## 2022-08-15 DIAGNOSIS — E782 Mixed hyperlipidemia: Secondary | ICD-10-CM | POA: Diagnosis not present

## 2022-08-15 DIAGNOSIS — S46811A Strain of other muscles, fascia and tendons at shoulder and upper arm level, right arm, initial encounter: Secondary | ICD-10-CM | POA: Insufficient documentation

## 2022-08-15 DIAGNOSIS — S4991XA Unspecified injury of right shoulder and upper arm, initial encounter: Secondary | ICD-10-CM | POA: Insufficient documentation

## 2022-08-15 MED ORDER — ROSUVASTATIN CALCIUM 20 MG PO TABS: 20.0000 mg | ORAL_TABLET | Freq: Every day | ORAL | 3 refills | Status: DC

## 2022-08-15 MED ORDER — LISINOPRIL 20 MG PO TABS: 20.0000 mg | ORAL_TABLET | Freq: Every day | ORAL | 3 refills | Status: DC

## 2022-08-15 MED ORDER — ROSUVASTATIN CALCIUM 20 MG PO TABS: 20.0000 mg | ORAL_TABLET | Freq: Every day | ORAL | 3 refills | Status: AC

## 2022-08-15 MED ORDER — LISINOPRIL 20 MG PO TABS: 20.0000 mg | ORAL_TABLET | Freq: Every day | ORAL | 3 refills | Status: AC

## 2022-08-15 NOTE — Assessment & Plan Note (Signed)
Ordered MRI to confirm injury to supraspinatus, or any other rotator cuff muscle.  Will refer to orthopedics if confirmed on MRI.

## 2022-08-15 NOTE — Assessment & Plan Note (Signed)
Ordered MRI to confirm labral tear.  Will refer to orthopedics if torn.

## 2022-08-15 NOTE — Progress Notes (Signed)
Subjective:  Patient ID: Reginald Kaiser, male    DOB: [birth year]  Age: 32 y.o. MRN: [id number]  Chief Complaint  Patient presents with   Medical Management of Chronic Issues    HPI Patient is a 32 year old man in today for follow up for hypertension. Patient stated that is has some right shoulder pain that been going for a year but has got worst in the last couple of months. Patient has stop his Crestor and Hydrodiuril due to not having insurance and wasn't able to afford coming to doctor. Patient states that he has been able to take his Bp at home and is about the same as today. Patient denies any vision changes, HA, dizziness, syncope. Patient admits to having a family history of BP. States he has been dealing with this since he was 14. Was at one point on 6 different bp medicines and states it still was super high. Patient states his right shoulder has been bothering him for over a year. Patient admits it clicking and then being stuck not able to abduct. Normally it will take about 24 hours for the shoulder to return to normal range of motion. 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  Patient states that in the last year it has gotten progressively worse to a 7 out of 10 pain. Taken ibuprofen and tylenol at the max dose without any pain relief.  Patient stated he has been tested for HIV and hepatitis C before while working at the orthopedic office.  Hyperlipidemia: Current medications: Diet control  Hypertension: Complications: Current medications: Cardizem 60 mg daily,   Diet: regular Exercise: work no extra exercise.        08/15/2022    7:31 AM 11/06/2020    9:17 AM 09/27/2020    1:49 PM  Depression screen PHQ 2/9  Decreased Interest 0 0 0  Down, Depressed, Hopeless 0 0 0  PHQ - 2 Score 0 0 0  Altered sleeping 1    Tired, decreased energy 0    Change in appetite 0    Feeling bad or failure about  yourself  0    Trouble concentrating 1    Moving slowly or fidgety/restless 0    Suicidal thoughts 0    PHQ-9 Score 2    Difficult doing work/chores Not difficult at all          08/15/2022    7:31 AM  Fall Risk   Falls in the past year? 0  Number falls in past yr: 0  Injury with Fall? 0  Risk for fall due to : No Fall Risks  Follow up Falls evaluation completed    Patient Care Team: Langley Gauss, [place] as PCP - General (Physician Assistant)   Review of Systems  Constitutional:  Negative for fatigue.  HENT:  Negative for congestion, ear pain and sore throat.   Respiratory:  Negative for cough and shortness of breath.   Cardiovascular:  Negative for chest pain.  Gastrointestinal:  Negative for abdominal pain, constipation, diarrhea, nausea and vomiting.  Genitourinary:  Negative for dysuria, frequency and urgency.  Musculoskeletal:  Positive for arthralgias. Negative for back pain and myalgias.  Neurological:  Negative for dizziness and headaches.  Psychiatric/Behavioral:  Negative for agitation and sleep disturbance. The patient is not nervous/anxious.     No current outpatient medications on file prior to visit.   No current facility-administered medications on file prior to visit.  Past Medical History:  Diagnosis Date   Essential hypertension 10/31/2015   Mixed hyperlipidemia 05/27/2016   PTSD (post-traumatic stress disorder) 02/05/2017   Past Surgical History:  Procedure Laterality Date   MULTIPLE TOOTH EXTRACTIONS  2018    Family History  Problem Relation Age of Onset   Diabetes Mother    Hypertension Mother    Stroke Mother    Hyperlipidemia Father    Social History   Socioeconomic History   Marital status: Single    Spouse name: Not on file   Number of children: 1   Years of education: Not on file   Highest education level: Not on file  Occupational History   Occupation: medical assistant  Tobacco Use   Smoking status: Former    Types: Cigarettes     Quit date: 2015    Years since quitting: 9.4   Smokeless tobacco: Never  Vaping Use   Vaping Use: Never used  Substance and Sexual Activity   Alcohol use: Yes    Alcohol/week: 2.0 standard drinks of alcohol    Types: 2 Standard drinks or equivalent per week    Comment: every night   Drug use: Never   Sexual activity: Yes    Partners: Female  Other Topics Concern   Not on file  Social History Narrative   Not on file   Social Determinants of Health   Financial Resource Strain: Not on file  Food Insecurity: Not on file  Transportation Needs: Not on file  Physical Activity: Not on file  Stress: Not on file  Social Connections: Not on file    Objective:  BP (!) 152/102 (BP Location: Left Arm, Patient Position: Sitting, Cuff Size: Large)   Pulse 87   Temp (!) 97.3 F (36.3 C) (Temporal)   Ht 5\' 8"  (1.727 m)   Wt 170 lb (77.1 kg)   SpO2 98%   BMI 25.85 kg/m      08/15/2022    9:02 AM 08/15/2022    7:29 AM 11/06/2020    9:15 AM  BP/Weight  Systolic BP 152 158 125  Diastolic BP 102 102 70  Wt. (Lbs)  170 171  BMI  25.85 kg/m2 26 kg/m2    Physical Exam Constitutional:      Appearance: Normal appearance.  Cardiovascular:     Rate and Rhythm: Normal rate and regular rhythm.     Heart sounds: Normal heart sounds.  Pulmonary:     Effort: Pulmonary effort is normal.     Breath sounds: Normal breath sounds.  Abdominal:     General: Bowel sounds are normal.     Palpations: Abdomen is soft.  Musculoskeletal:        General: Tenderness present.     Right shoulder: Tenderness present. Decreased range of motion. Decreased strength.     Left shoulder: Normal.     Comments: Pain described as a stabbing sharp pain that starts in the front and goes to the back.  Neurological:     Mental Status: He is alert and oriented to person, place, and time.  Psychiatric:        Behavior: Behavior normal.     Diabetic Foot Exam - Simple   No data filed      Lab Results   Component Value Date   WBC 5.0 08/15/2022   HGB 15.8 08/15/2022   HCT 46.9 08/15/2022   PLT 261 08/15/2022   GLUCOSE 91 08/15/2022   CHOL 208 (H) 08/15/2022   TRIG 300 (  H) 08/15/2022   HDL 48 08/15/2022   LDLCALC 109 (H) 08/15/2022   ALT 33 08/15/2022   AST 25 08/15/2022   NA 142 08/15/2022   K 4.4 08/15/2022   CL 103 08/15/2022   CREATININE 1.01 08/15/2022   BUN 10 08/15/2022   CO2 22 08/15/2022      Assessment & Plan:    Essential hypertension Assessment & Plan: Patient has been unable to take hypertension medication for over a year.  We will start him today on lisinopril 20 mg.  And follow-up with him to make sure that his blood pressure is decreasing.  Orders: -     CBC with Differential/Platelet -     CMP14+EGFR -     Lisinopril; Take 1 tablet (20 mg total) by mouth daily.  Dispense: 90 tablet; Refill: 3  Mixed hyperlipidemia Assessment & Plan: Restarted the patient's rosuvastatin 20 mg preemptively.  Will look at labs after they are posted to confirm correct dosing.  Orders: -     Lipid panel -     Rosuvastatin Calcium; Take 1 tablet (20 mg total) by mouth daily.  Dispense: 90 tablet; Refill: 3  Sprain, supraspinatus, right, initial encounter Assessment & Plan: Ordered MRI to confirm injury to supraspinatus, or any other rotator cuff muscle.  Will refer to orthopedics if confirmed on MRI.  Orders: -     MR SHOULDER RIGHT WO CONTRAST; Future  Injury of right glenoid labrum Assessment & Plan: Ordered MRI to confirm labral tear.  Will refer to orthopedics if torn.   Other orders -     Cardiovascular Risk Assessment     Meds ordered this encounter  Medications   DISCONTD: lisinopril (ZESTRIL) 20 MG tablet    Sig: Take 1 tablet (20 mg total) by mouth daily.    Dispense:  90 tablet    Refill:  3   DISCONTD: rosuvastatin (CRESTOR) 20 MG tablet    Sig: Take 1 tablet (20 mg total) by mouth daily.    Dispense:  90 tablet    Refill:  3   lisinopril  (ZESTRIL) 20 MG tablet    Sig: Take 1 tablet (20 mg total) by mouth daily.    Dispense:  90 tablet    Refill:  3   rosuvastatin (CRESTOR) 20 MG tablet    Sig: Take 1 tablet (20 mg total) by mouth daily.    Dispense:  90 tablet    Refill:  3    Orders Placed This Encounter  Procedures   MR Shoulder Right Wo Contrast   CBC with Differential/Platelet   CMP14+EGFR   Lipid panel   Cardiovascular Risk Assessment     Follow-up: Return in about 3 months (around 11/15/2022) for fasting, Huston Foley, Chronic.   I,Jacqua L Marsh,acting as a scribe for [employer], PA.,have documented all relevant documentation on the behalf of Langley Gauss, PA,as directed by  Langley Gauss, PA while in the presence of Langley Gauss, [place].   An After Visit Summary was printed and given to the patient.  Langley Gauss, [place] [facility] [phone]

## 2022-08-15 NOTE — Patient Instructions (Signed)

## 2022-08-15 NOTE — Assessment & Plan Note (Signed)
Patient has been unable to take hypertension medication for over a year.  We will start him today on lisinopril 20 mg.  And follow-up with him to make sure that his blood pressure is decreasing.

## 2022-08-15 NOTE — Assessment & Plan Note (Signed)
Restarted the patient's rosuvastatin 20 mg preemptively.  Will look at labs after they are posted to confirm correct dosing.

## 2022-08-16 LAB — LIPID PANEL
Chol/HDL Ratio: 4.3 ratio (ref 0.0–5.0)
Cholesterol, Total: 208 mg/dL — ABNORMAL HIGH (ref 100–199)
HDL: 48 mg/dL (ref >39)
LDL Chol Calc (NIH): 109 mg/dL — ABNORMAL HIGH (ref 0–99)
Triglycerides: 300 mg/dL — ABNORMAL HIGH (ref 0–149)
VLDL Cholesterol Cal: 51 mg/dL — ABNORMAL HIGH (ref 5–40)

## 2022-08-16 LAB — CBC WITH DIFFERENTIAL/PLATELET
Basophils Absolute: 0 10*3/uL (ref 0.0–0.2)
Basos: 1 %
EOS (ABSOLUTE): 0.3 10*3/uL (ref 0.0–0.4)
Eos: 6 %
Hematocrit: 46.9 % (ref 37.5–51.0)
Hemoglobin: 15.8 g/dL (ref 13.0–17.7)
Immature Grans (Abs): 0 10*3/uL (ref 0.0–0.1)
Immature Granulocytes: 0 %
Lymphocytes Absolute: 1.7 10*3/uL (ref 0.7–3.1)
Lymphs: 33 %
MCH: 30.7 pg (ref 26.6–33.0)
MCHC: 33.7 g/dL (ref 31.5–35.7)
MCV: 91 fL (ref 79–97)
Monocytes Absolute: 0.4 10*3/uL (ref 0.1–0.9)
Monocytes: 7 %
Neutrophils Absolute: 2.7 10*3/uL (ref 1.4–7.0)
Neutrophils: 53 %
Platelets: 261 10*3/uL (ref 150–450)
RBC: 5.15 x10E6/uL (ref 4.14–5.80)
RDW: 12.1 % (ref 11.6–15.4)
WBC: 5 10*3/uL (ref 3.4–10.8)

## 2022-08-16 LAB — CMP14+EGFR
ALT: 33 IU/L (ref 0–44)
AST: 25 IU/L (ref 0–40)
Albumin/Globulin Ratio: 2 (ref 1.2–2.2)
Albumin: 4.6 g/dL (ref 4.1–5.1)
Alkaline Phosphatase: 90 IU/L (ref 44–121)
BUN/Creatinine Ratio: 10 (ref 9–20)
BUN: 10 mg/dL (ref 6–20)
Bilirubin Total: 0.8 mg/dL (ref 0.0–1.2)
CO2: 22 mmol/L (ref 20–29)
Calcium: 9.3 mg/dL (ref 8.7–10.2)
Chloride: 103 mmol/L (ref 96–106)
Creatinine, Ser: 1.01 mg/dL (ref 0.76–1.27)
Globulin, Total: 2.3 g/dL (ref 1.5–4.5)
Glucose: 91 mg/dL (ref 70–99)
Potassium: 4.4 mmol/L (ref 3.5–5.2)
Sodium: 142 mmol/L (ref 134–144)
Total Protein: 6.9 g/dL (ref 6.0–8.5)
eGFR: 101 mL/min/{1.73_m2} (ref >59)

## 2022-08-19 ENCOUNTER — Other Ambulatory Visit: Payer: Self-pay | Admitting: Family Medicine

## 2022-08-19 DIAGNOSIS — I10 Essential (primary) hypertension: Secondary | ICD-10-CM

## 2022-08-20 ENCOUNTER — Encounter: Payer: Self-pay | Admitting: Physician Assistant

## 2022-08-20 ENCOUNTER — Other Ambulatory Visit: Payer: Self-pay

## 2022-08-20 DIAGNOSIS — I10 Essential (primary) hypertension: Secondary | ICD-10-CM

## 2022-08-20 MED ORDER — DILTIAZEM HCL 60 MG PO TABS: ORAL_TABLET | ORAL | 0 refills | Status: DC

## 2022-09-19 ENCOUNTER — Other Ambulatory Visit: Payer: Self-pay | Admitting: Physician Assistant

## 2022-09-19 DIAGNOSIS — I10 Essential (primary) hypertension: Secondary | ICD-10-CM

## 2022-09-19 MED ORDER — DILTIAZEM HCL 60 MG PO TABS: ORAL_TABLET | ORAL | 0 refills | Status: AC

## 2022-09-24 ENCOUNTER — Other Ambulatory Visit: Payer: Self-pay | Admitting: Physician Assistant

## 2022-09-24 DIAGNOSIS — I10 Essential (primary) hypertension: Secondary | ICD-10-CM

## 2022-09-25 DIAGNOSIS — I10 Essential (primary) hypertension: Secondary | ICD-10-CM

## 2022-10-02 IMAGING — CT CT HEAD W/O CM
3 series · 16 of 47 positions shown, 19 images · non-contrast
Comparison: None.

CLINICAL DATA: Chronic headaches

EXAM:
CT HEAD WITHOUT CONTRAST
TECHNIQUE: Contiguous axial images were obtained from the base of the skull
through the vertex without intravenous contrast.

[Series 4: head 5.0 h30s · axial · 0.44mm/px · z∈[-98,+32]mm · 10 of 32 slices shown, 13 images]
[im 3/32  brain]
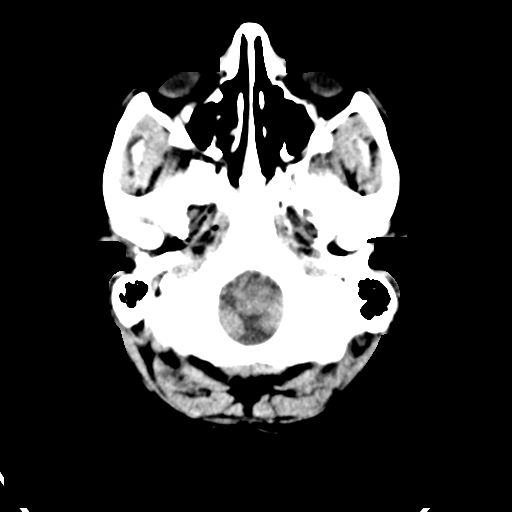
[im 3/32  bone]
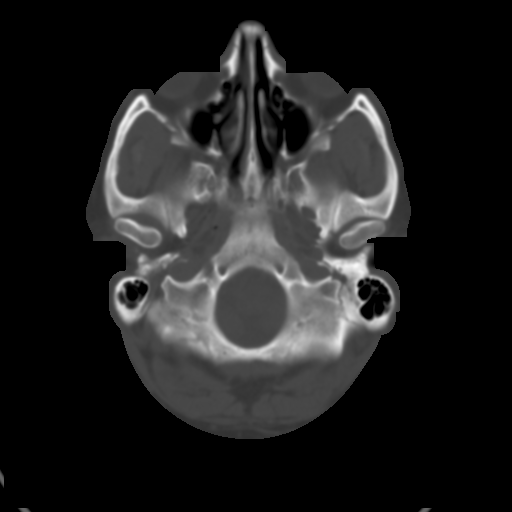
[im 6/32  brain]
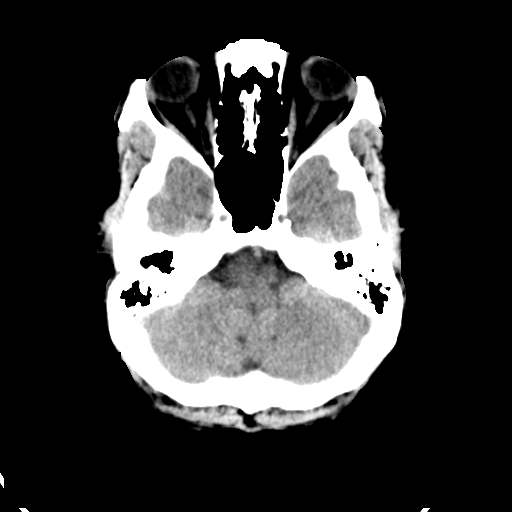
[im 9/32  brain]
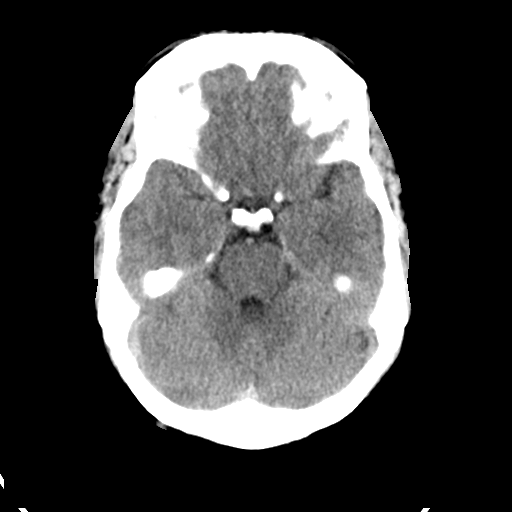
[im 11/32  brain]
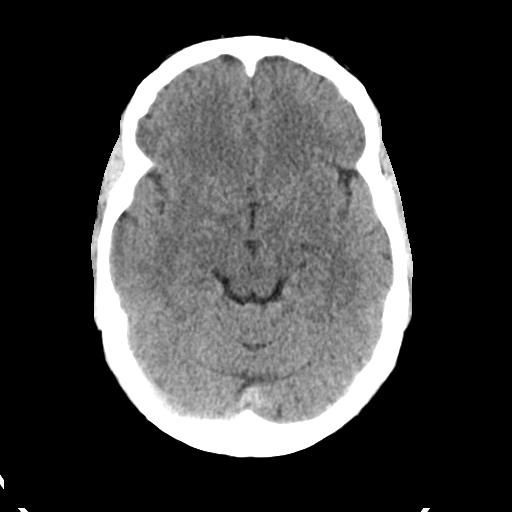
[im 14/32  brain]
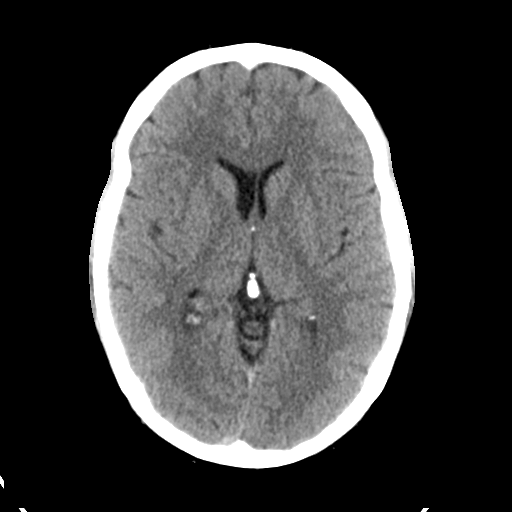
[im 14/32  bone]
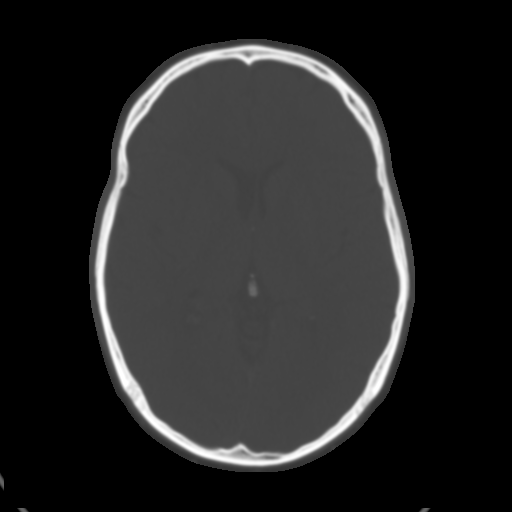
[im 18/32  brain]
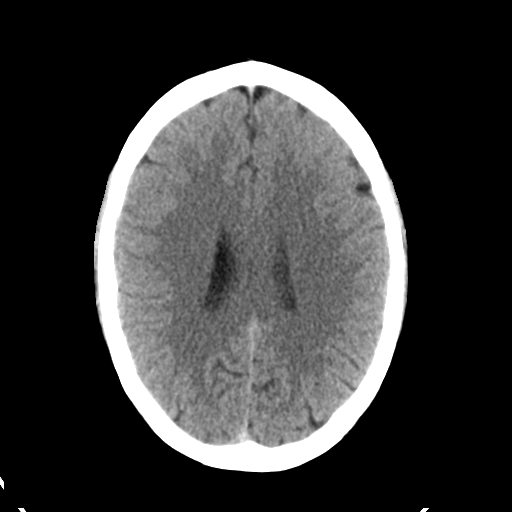
[im 21/32  brain]
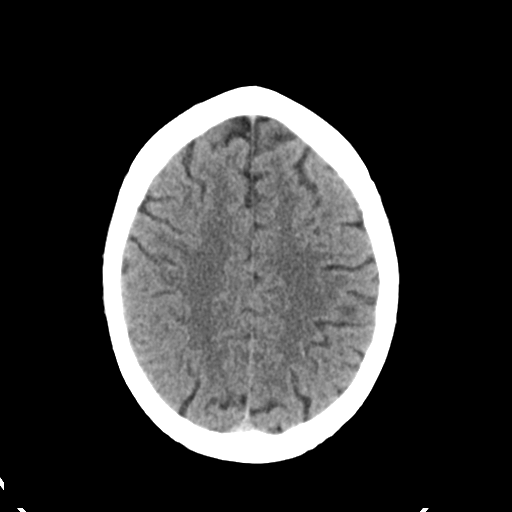
[im 24/32  brain]
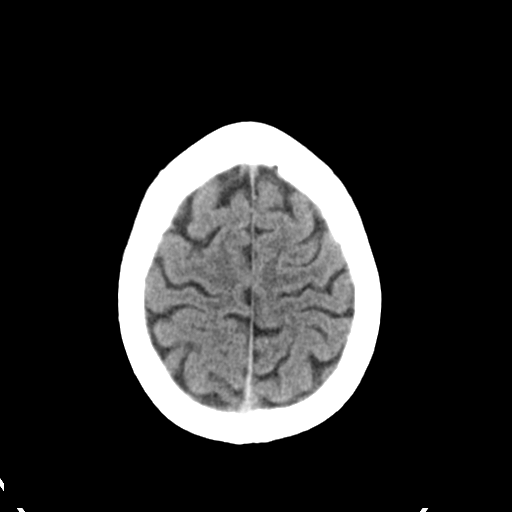
[im 26/32  brain]
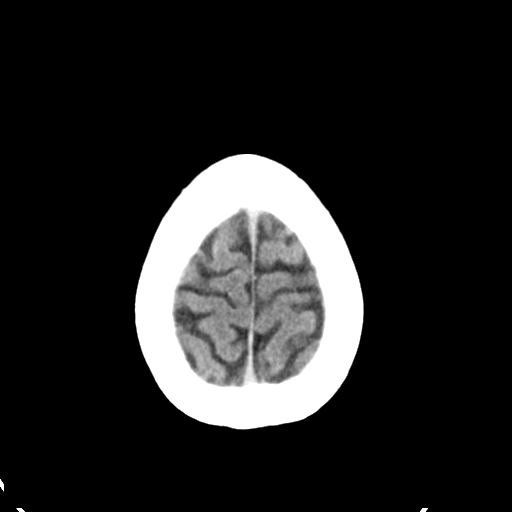
[im 26/32  bone]
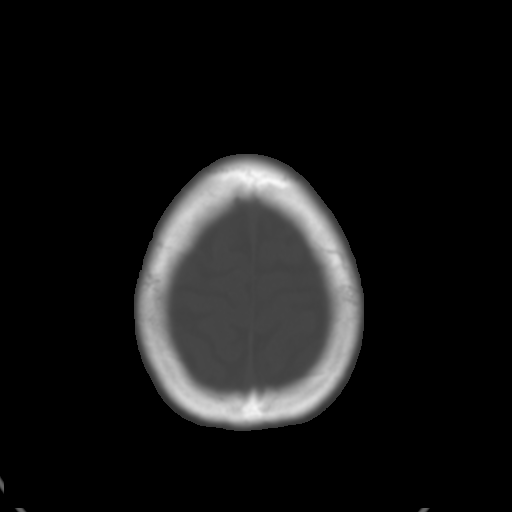
[im 29/32  brain]
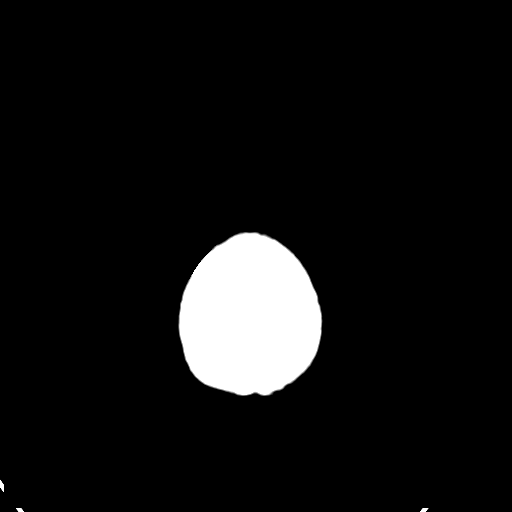

[Series 5: head 3.0 mpr cor · coronal · 0.30mm/px · 3 of 70 slices shown]
[im 24/70  brain]
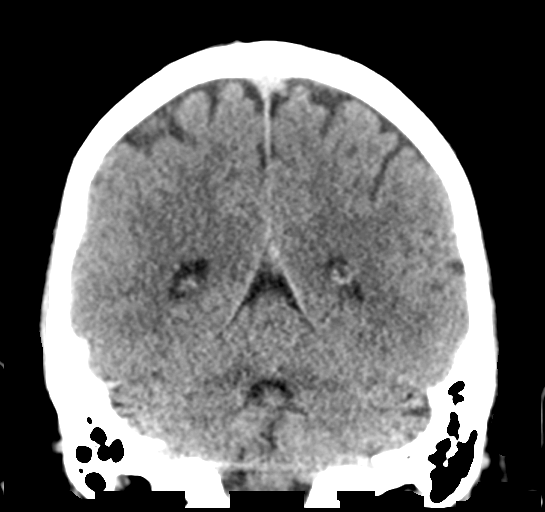
[im 31/70  brain]
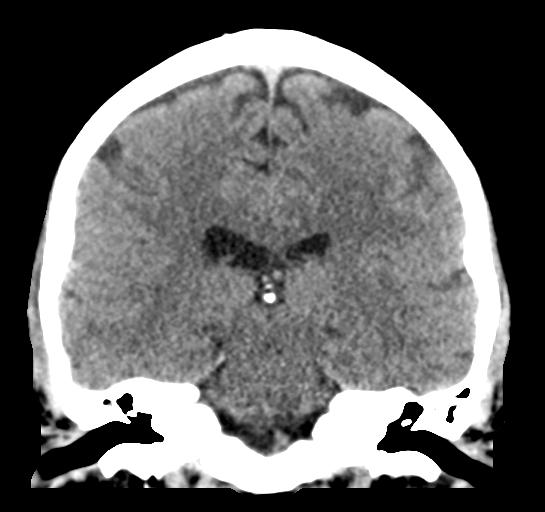
[im 39/70  brain]
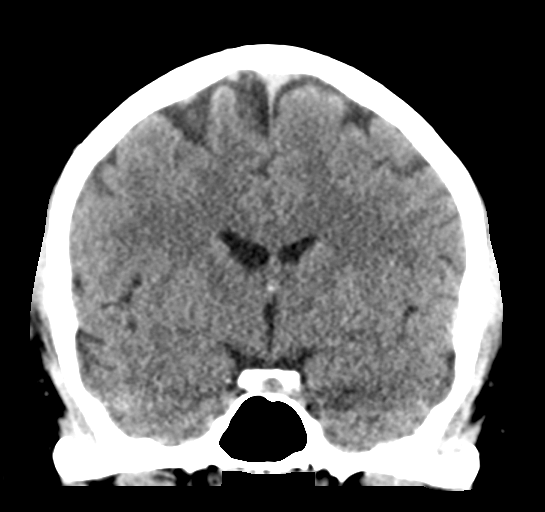

[Series 6: head 3.0 mpr sag · sagittal · 0.33mm/px · 3 of 56 slices shown]
[im 19/56  brain]
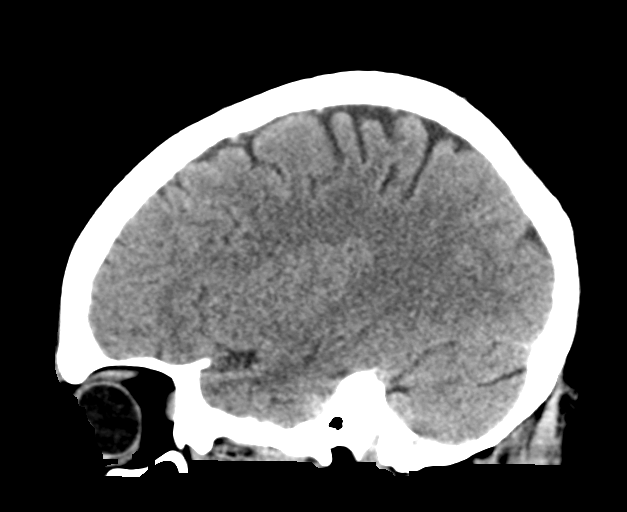
[im 28/56  brain]
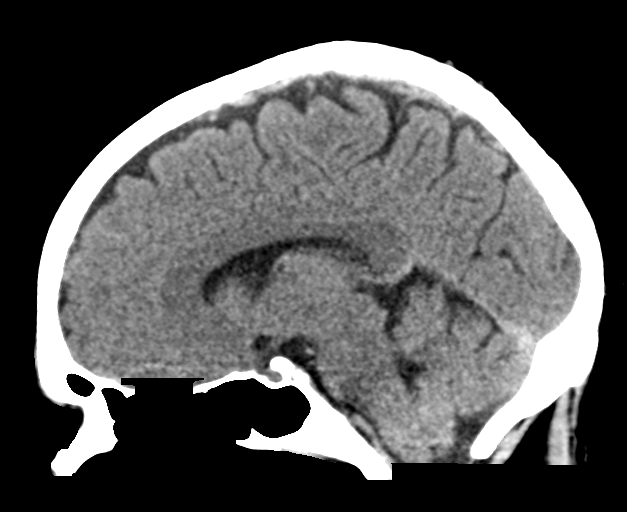
[im 37/56  brain]
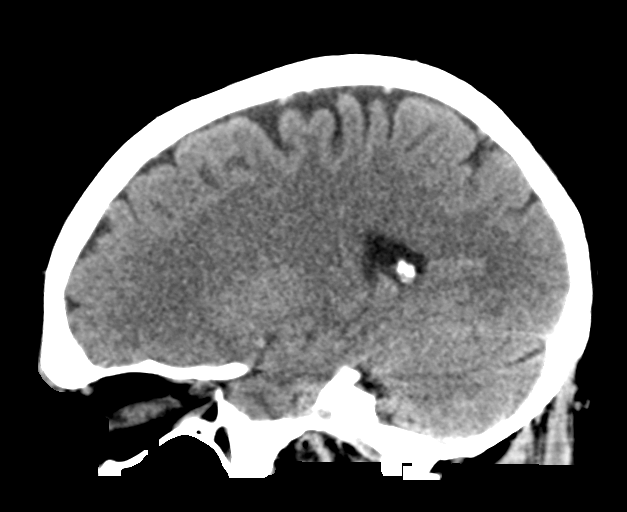

[16 of 47 positions shown; findings below may reference images not displayed]

FINDINGS: Brain: No evidence of acute infarction, hemorrhage, hydrocephalus,
extra-axial collection or mass lesion/mass effect.

Vascular: No hyperdense vessel or unexpected calcification.

Skull: Normal. Negative for fracture or focal lesion.

Sinuses/Orbits: No acute finding.

Other: None.
IMPRESSION: No acute intracranial abnormality noted.

## 2022-11-20 NOTE — Progress Notes (Unsigned)
Subjective:  Patient ID: Reginald Kaiser, male    DOB: 17-Dec-1990  Age: 32 y.o. MRN: [id number]  No chief complaint on file.   HPI   Hypertension: Taking Lisinopril 20 mg daily  Hyperlipidemia: Current medications: Diet control  Hypertension: Complications: Current medications: Cardizem 60 mg daily,   Diet: regular Exercise: work no extra exercise.     08/15/2022    7:31 AM 11/06/2020    9:17 AM 09/27/2020    1:49 PM  Depression screen PHQ 2/9  Decreased Interest 0 0 0  Down, Depressed, Hopeless 0 0 0  PHQ - 2 Score 0 0 0  Altered sleeping 1    Tired, decreased energy 0    Change in appetite 0    Feeling bad or failure about yourself  0    Trouble concentrating 1    Moving slowly or fidgety/restless 0    Suicidal thoughts 0    PHQ-9 Score 2    Difficult doing work/chores Not difficult at all          08/15/2022    7:31 AM  Fall Risk   Falls in the past year? 0  Number falls in past yr: 0  Injury with Fall? 0  Risk for fall due to : No Fall Risks  Follow up Falls evaluation completed    Patient Care Team: Langley Gauss, [place] as PCP - General (Physician Assistant)   Review of Systems  Current Outpatient Medications on File Prior to Visit  Medication Sig Dispense Refill   diltiazem (CARDIZEM) 60 MG tablet TAKE ONE TABLET (60omg) BY MOUTH TWICE DAILY 60 tablet 0   lisinopril (ZESTRIL) 20 MG tablet Take 1 tablet (20 mg total) by mouth daily. 90 tablet 3   rosuvastatin (CRESTOR) 20 MG tablet Take 1 tablet (20 mg total) by mouth daily. 90 tablet 3   No current facility-administered medications on file prior to visit.   Past Medical History:  Diagnosis Date   Essential hypertension 10/31/2015   Mixed hyperlipidemia 05/27/2016   PTSD (post-traumatic stress disorder) 02/05/2017   Past Surgical History:  Procedure Laterality Date   MULTIPLE TOOTH EXTRACTIONS  2018    Family History  Problem Relation Age of Onset   Diabetes Mother    Hypertension Mother     Stroke Mother    Hyperlipidemia Father    Social History   Socioeconomic History   Marital status: Single    Spouse name: Not on file   Number of children: 1   Years of education: Not on file   Highest education level: Not on file  Occupational History   Occupation: medical assistant  Tobacco Use   Smoking status: Former    Current packs/day: 0.00    Types: Cigarettes    Quit date: 2015    Years since quitting: 9.6   Smokeless tobacco: Never  Vaping Use   Vaping status: Never Used  Substance and Sexual Activity   Alcohol use: Yes    Alcohol/week: 2.0 standard drinks of alcohol    Types: 2 Standard drinks or equivalent per week    Comment: every night   Drug use: Never   Sexual activity: Yes    Partners: Female  Other Topics Concern   Not on file  Social History Narrative   Not on file   Social Determinants of Health   Financial Resource Strain: Not on file  Food Insecurity: Low Risk  (10/24/2022)   Received from [facility]   Food vital sign  Within the past 12 months, you worried that your food would run out before you got money to buy more: Never true    Within the past 12 months, the food you bought just didn't last and you didn't have money to get more. : Never true  Transportation Needs: Not on file (10/24/2022)  Physical Activity: Not on file  Stress: Not on file  Social Connections: Not on file    Objective:  There were no vitals taken for this visit.     08/15/2022    9:02 AM 08/15/2022    7:29 AM 11/06/2020    9:15 AM  BP/Weight  Systolic BP 152 158 125  Diastolic BP 102 102 70  Wt. (Lbs)  170 171  BMI  25.85 kg/m2 26 kg/m2    Physical Exam  Diabetic Foot Exam - Simple   No data filed      Lab Results  Component Value Date   WBC 5.0 08/15/2022   HGB 15.8 08/15/2022   HCT 46.9 08/15/2022   PLT 261 08/15/2022   GLUCOSE 91 08/15/2022   CHOL 208 (H) 08/15/2022   TRIG 300 (H) 08/15/2022   HDL 48 08/15/2022   LDLCALC 109 (H)  08/15/2022   ALT 33 08/15/2022   AST 25 08/15/2022   NA 142 08/15/2022   K 4.4 08/15/2022   CL 103 08/15/2022   CREATININE 1.01 08/15/2022   BUN 10 08/15/2022   CO2 22 08/15/2022      Assessment & Plan:    Essential hypertension  Mixed hyperlipidemia     No orders of the defined types were placed in this encounter.   No orders of the defined types were placed in this encounter.    Follow-up: No follow-ups on file.   I,Chaunta Bejarano I Leal-Borjas,acting as a scribe for [employer], PA.,have documented all relevant documentation on the behalf of Langley Gauss, PA,as directed by  Langley Gauss, PA while in the presence of Langley Gauss, [place].   An After Visit Summary was printed and given to the patient.  Langley Gauss, [place] [facility] [phone]

## 2022-11-21 ENCOUNTER — Encounter: Payer: Medicaid Other | Admitting: Physician Assistant

## 2022-11-21 DIAGNOSIS — I10 Essential (primary) hypertension: Secondary | ICD-10-CM

## 2022-11-21 DIAGNOSIS — E782 Mixed hyperlipidemia: Secondary | ICD-10-CM

## 2022-11-26 NOTE — Progress Notes (Signed)
This encounter was created in error - please disregard.  No Show
